# Patient Record
Sex: Male | Born: 1990 | Race: White | Hispanic: No | Marital: Single | Smoking: Current every day smoker
Health system: Southern US, Community
[De-identification: ages and names within clinical notes are randomized; demographics above are authoritative.]

## PROBLEM LIST (undated history)

## (undated) HISTORY — PX: FRACTURE SURGERY: SHX138

---

## 2014-06-18 ENCOUNTER — Emergency Department: Payer: Self-pay | Admitting: Emergency Medicine

## 2015-10-24 IMAGING — CR DG HAND COMPLETE 3+V*L*
1 series · 3 of 3 positions shown · non-contrast
Comparison: None.

CLINICAL DATA: For body and left hand. Abscess on the left hand at
the second MCP.

EXAM:
LEFT HAND - COMPLETE 3+ VIEW

[Series 1: pa · 0.17mm/px · 3 of 3 slices shown]
[im 1/3]
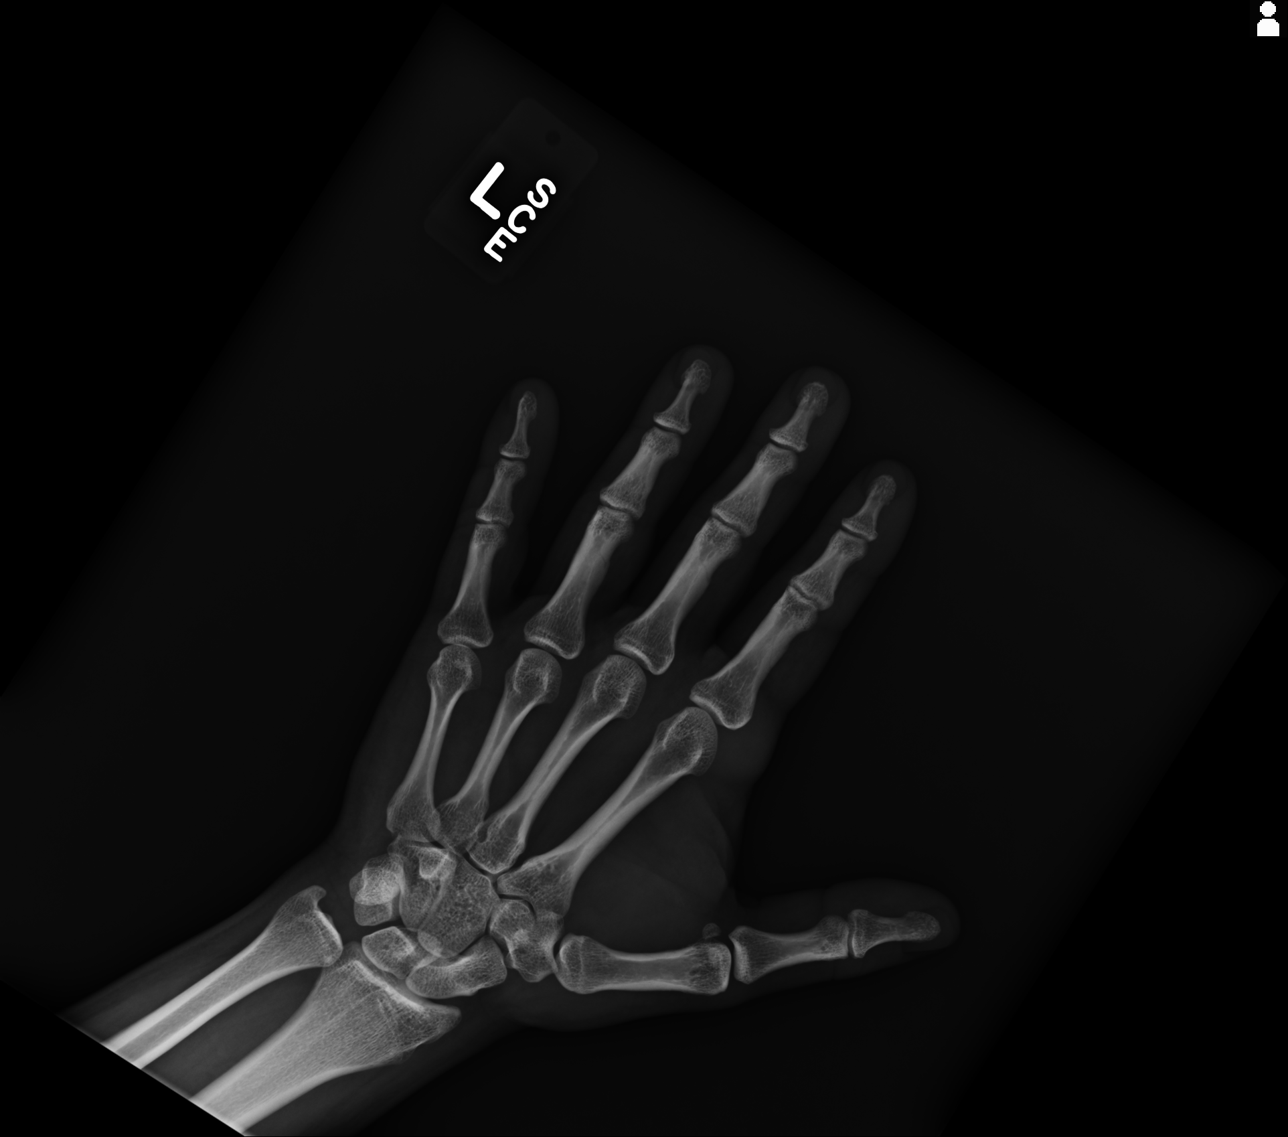
[im 2/3]
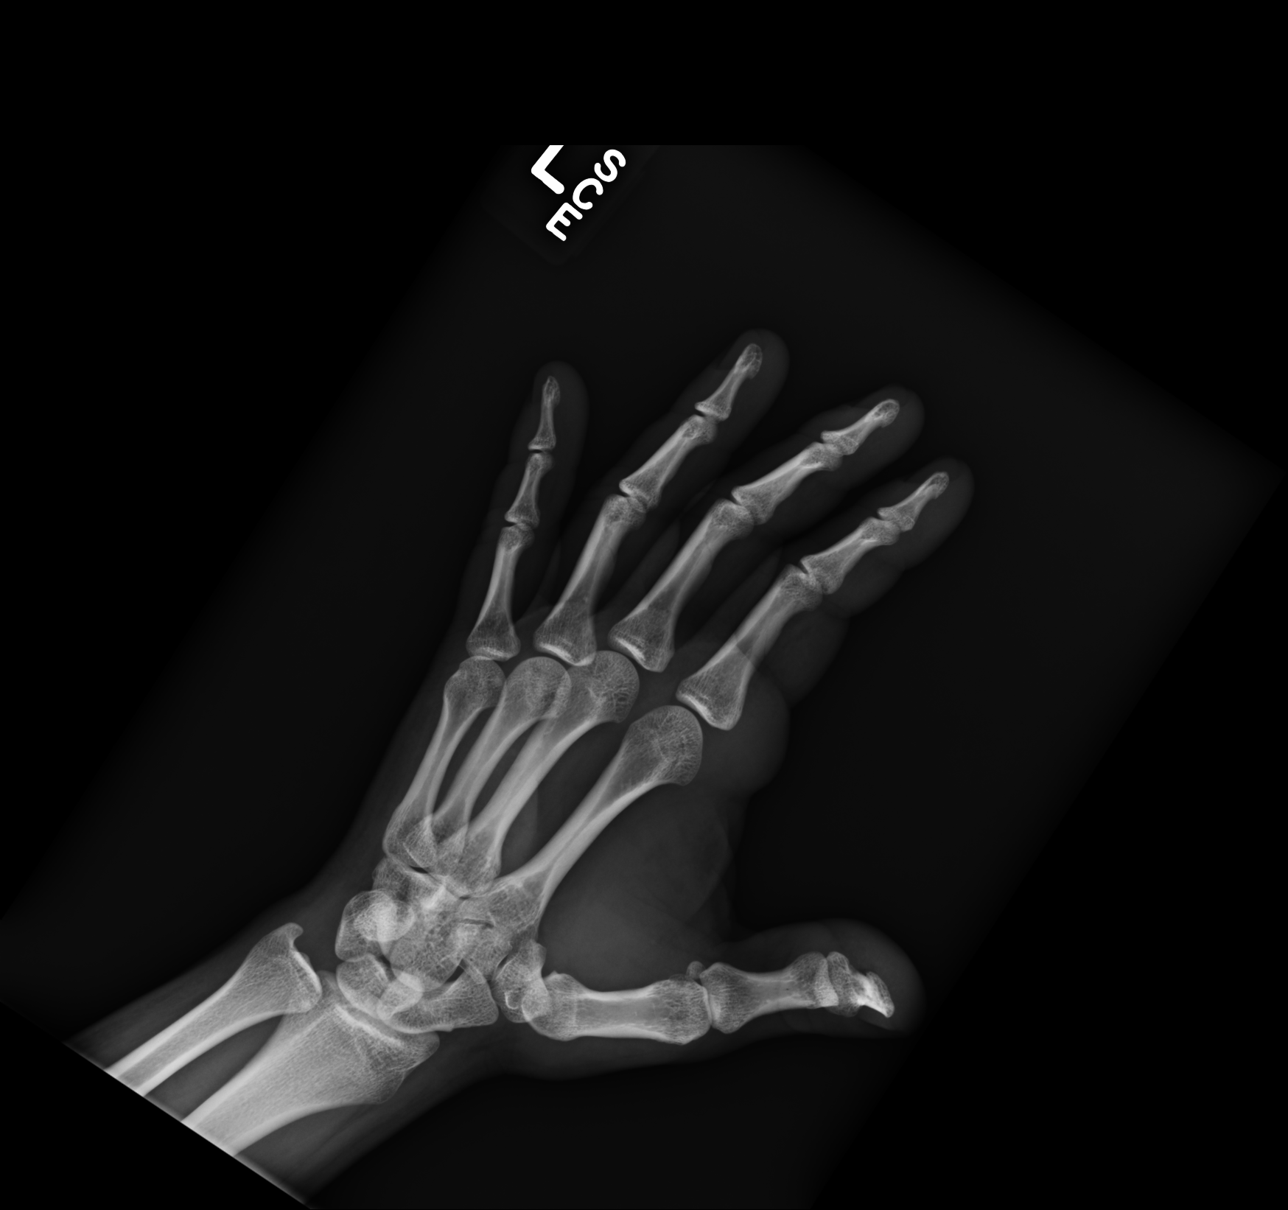
[im 3/3]
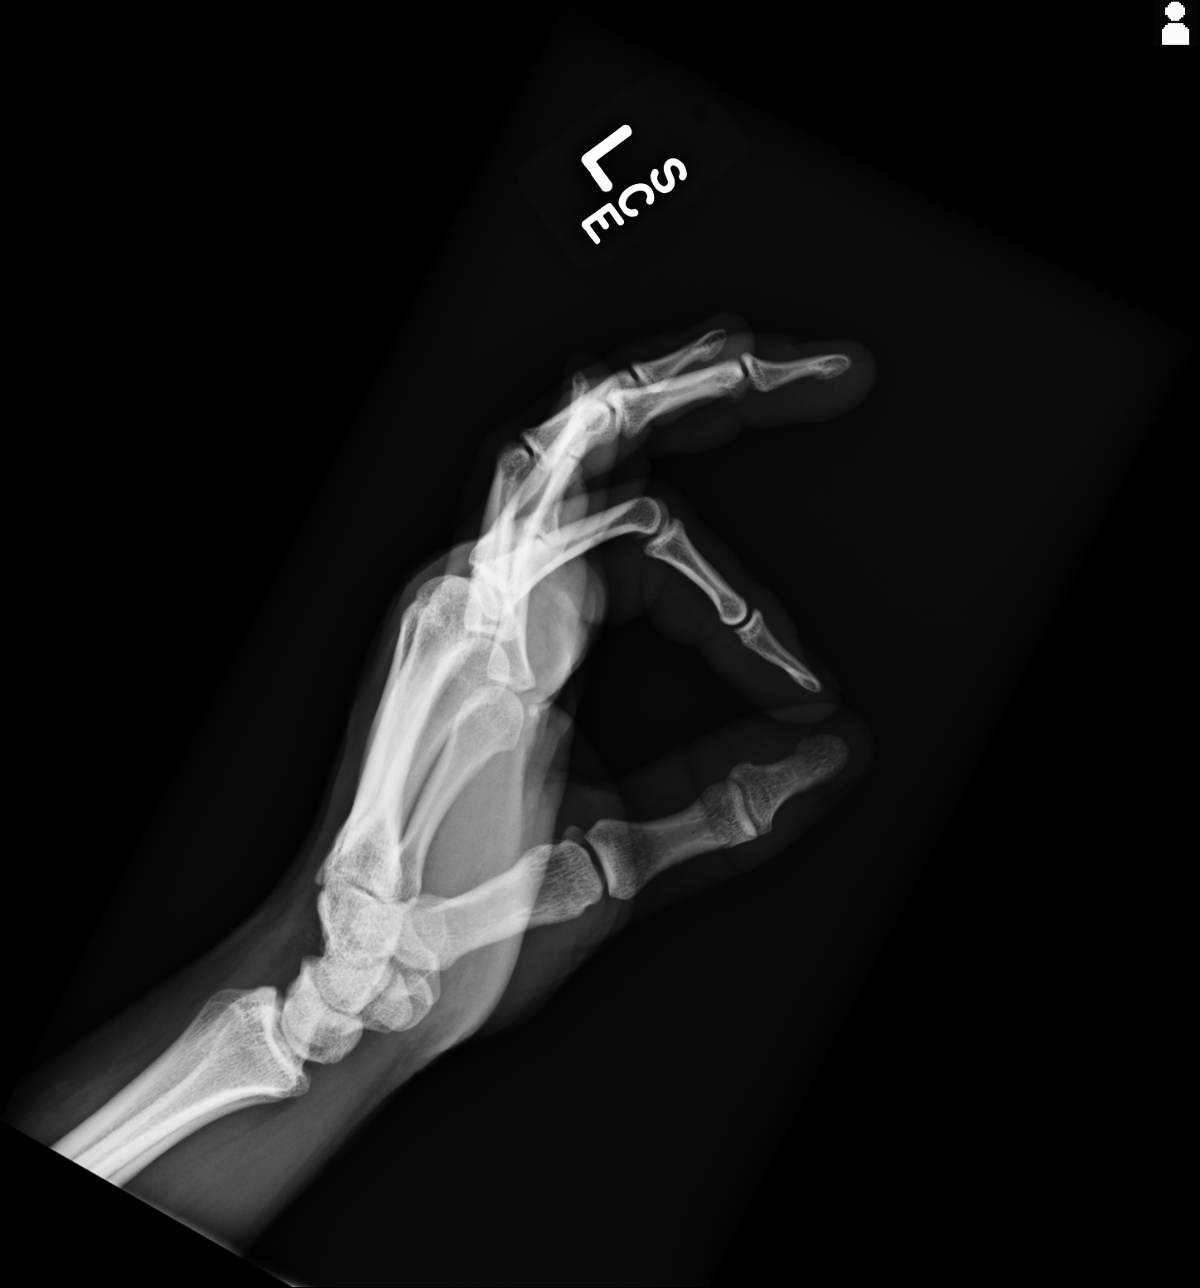

[3 of 3 positions shown; findings below may reference images not displayed]

FINDINGS: There is no evidence of fracture or dislocation. There is no
evidence of arthropathy or other focal bone abnormality. Soft tissue
swelling around the second MCP joint. No radiopaque foreign body
identified.
IMPRESSION: 1. Focal soft tissue swelling around the second MCP joint of the
left hand. No radiopaque foreign body. No osseous findings to
suggest osteomyelitis.

## 2019-04-17 ENCOUNTER — Encounter: Payer: Self-pay | Admitting: Emergency Medicine

## 2019-04-17 ENCOUNTER — Other Ambulatory Visit: Payer: Self-pay

## 2019-04-17 ENCOUNTER — Ambulatory Visit
Admission: EM | Admit: 2019-04-17 | Discharge: 2019-04-17 | Disposition: A | Discharge status: Home / Self Care | Payer: BC Managed Care – PPO | Attending: Family Medicine | Admitting: Family Medicine

## 2019-04-17 DIAGNOSIS — R112 Nausea with vomiting, unspecified: Secondary | ICD-10-CM

## 2019-04-17 LAB — COMPREHENSIVE METABOLIC PANEL
ALT: 17 U/L (ref 0–44)
AST: 15 U/L (ref 15–41)
Albumin: 4.6 g/dL (ref 3.5–5.0)
Alkaline Phosphatase: 59 U/L (ref 38–126)
Anion gap: 12 (ref 5–15)
BUN: 12 mg/dL (ref 6–20)
CO2: 22 mmol/L (ref 22–32)
Calcium: 8.8 mg/dL — ABNORMAL LOW (ref 8.9–10.3)
Chloride: 104 mmol/L (ref 98–111)
Creatinine, Ser: 0.59 mg/dL — ABNORMAL LOW (ref 0.61–1.24)
GFR calc Af Amer: >60 mL/min (ref >60)
GFR calc non Af Amer: >60 mL/min (ref >60)
Glucose, Bld: 102 mg/dL — ABNORMAL HIGH (ref 70–99)
Potassium: 3.7 mmol/L (ref 3.5–5.1)
Sodium: 138 mmol/L (ref 135–145)
Total Bilirubin: 0.8 mg/dL (ref 0.3–1.2)
Total Protein: 7.3 g/dL (ref 6.5–8.1)

## 2019-04-17 LAB — CBC WITH DIFFERENTIAL/PLATELET
Abs Immature Granulocytes: 0.05 10*3/uL (ref 0.00–0.07)
Basophils Absolute: 0 10*3/uL (ref 0.0–0.1)
Basophils Relative: 0 %
Eosinophils Absolute: 0.1 10*3/uL (ref 0.0–0.5)
Eosinophils Relative: 1 %
HCT: 47.3 % (ref 39.0–52.0)
Hemoglobin: 16.7 g/dL (ref 13.0–17.0)
Immature Granulocytes: 0 %
Lymphocytes Relative: 14 %
Lymphs Abs: 1.6 10*3/uL (ref 0.7–4.0)
MCH: 31.2 pg (ref 26.0–34.0)
MCHC: 35.3 g/dL (ref 30.0–36.0)
MCV: 88.4 fL (ref 80.0–100.0)
Monocytes Absolute: 0.5 10*3/uL (ref 0.1–1.0)
Monocytes Relative: 4 %
Neutro Abs: 9.2 10*3/uL — ABNORMAL HIGH (ref 1.7–7.7)
Neutrophils Relative %: 81 %
Platelets: 189 10*3/uL (ref 150–400)
RBC: 5.35 MIL/uL (ref 4.22–5.81)
RDW: 12.6 % (ref 11.5–15.5)
WBC: 11.4 10*3/uL — ABNORMAL HIGH (ref 4.0–10.5)
nRBC: 0 % (ref 0.0–0.2)

## 2019-04-17 LAB — LIPASE, BLOOD: Lipase: 27 U/L (ref 11–51)

## 2019-04-17 MED ORDER — ONDANSETRON HCL 4 MG PO TABS: 4.0000 mg | ORAL_TABLET | Freq: Three times a day (TID) | ORAL | 1 refills | Status: AC | PRN

## 2019-04-17 NOTE — ED Triage Notes (Signed)
Pt states he woke up this morning vomiting and coughed up blood. He states the blood was bright red. He states it was just one episode. He states that he drank last night and thinks it could possibly be related. Denies SOB, fever, or chest pain.

## 2019-04-17 NOTE — ED Provider Notes (Signed)
[facility]    CSN: [id number] Arrival date & time: 04/17/19  1334  History   Chief Complaint Chief Complaint  Patient presents with  . Emesis  . Hemoptysis   HPI  28 year old male presents with nausea, vomiting.  Patient states that he has coughed up blood.  Patient reports that he drank alcohol last night.  He states that he does not routinely drink alcohol.  Had 4 beers.  Had nausea and vomiting this morning.  Vomited several times.  Patient states that he subsequently noted blood when he coughed.  Patient has difficulty discerning whether he coughed up blood or vomited up blood.  No abdominal pain at this time.  No fever.  He has not eaten today.  He has drink some coffee.  He has had no further episodes of bleeding.  No medications or interventions tried.  No shortness of breath.  No cough at this time.  No other associated symptoms.  No other complaints.  PMH, Surgical Hx, Family Hx, Social History reviewed and updated as below.  No significant PMH.  Past Surgical History:  Procedure Laterality Date  . FRACTURE SURGERY Right    hand   Home Medications    Prior to Admission medications   Medication Sig Start Date End Date Taking? Authorizing Provider  Ascorbic Acid (VITAMIN C) 100 MG tablet Take 100 mg by mouth daily.   Yes [provider]  ondansetron (ZOFRAN) 4 MG tablet Take 1 tablet (4 mg total) by mouth every 8 (eight) hours as needed for nausea, vomiting or refractory nausea / vomiting. 04/17/19   Tommie Samsook, Joni Norrod G, DO    Family History Family History  Problem Relation Age of Onset  . Healthy Mother   . Healthy Father     Social History Social History   Tobacco Use  . Smoking status: Current Every Day Smoker    Packs/day: 1.00  . Smokeless tobacco: Never Used  Substance Use Topics  . Alcohol use: Yes  . Drug use: Yes    Types: Marijuana     Allergies   Patient has no known allergies.   Review of Systems Review of Systems   Constitutional: Negative for fever.  Respiratory: Negative for shortness of breath.        ? Hemoptysis vs hematemesis.  Gastrointestinal: Positive for nausea and vomiting.   Physical Exam Triage Vital Signs ED Triage Vitals  Enc Vitals Group     BP 04/17/19 1355 135/79     Pulse Rate 04/17/19 1355 75     Resp 04/17/19 1355 18     Temp 04/17/19 1355 97.8 F (36.6 C)     Temp Source 04/17/19 1355 Oral     SpO2 04/17/19 1355 99 %     Weight 04/17/19 1351 140 lb (63.5 kg)     Height 04/17/19 1351 5\' 6"  (1.676 m)     Head Circumference --      Peak Flow --      Pain Score 04/17/19 1351 0     Pain Loc --      Pain Edu? --      Excl. in [place]? --    Updated Vital Signs BP 135/79 (BP Location: Left Arm)   Pulse 75   Temp 97.8 F (36.6 C) (Oral)   Resp 18   Ht 5\' 6"  (1.676 m)   Wt 63.5 kg   SpO2 99%   BMI 22.60 kg/m   Visual Acuity Right Eye Distance:   Left  Eye Distance:   Bilateral Distance:    Right Eye Near:   Left Eye Near:    Bilateral Near:     Physical Exam Vitals signs and nursing note reviewed.  Constitutional:      General: He is not in acute distress.    Appearance: Normal appearance. He is not ill-appearing.  HENT:     Head: Normocephalic and atraumatic.  Eyes:     General:        Right eye: No discharge.        Left eye: No discharge.     Conjunctiva/sclera: Conjunctivae normal.  Cardiovascular:     Rate and Rhythm: Normal rate and regular rhythm.     Heart sounds: No murmur.  Pulmonary:     Effort: Pulmonary effort is normal. No respiratory distress.     Breath sounds: Normal breath sounds. No wheezing, rhonchi or rales.  Abdominal:     General: There is no distension.     Palpations: Abdomen is soft.     Tenderness: There is no abdominal tenderness. There is no guarding or rebound.  Neurological:     Mental Status: He is alert.  Psychiatric:        Mood and Affect: Mood normal.        Behavior: Behavior normal.    UC Treatments /  Results  Labs (all labs ordered are listed, but only abnormal results are displayed) Labs Reviewed  CBC WITH DIFFERENTIAL/PLATELET - Abnormal; Notable for the following components:      Result Value   WBC 11.4 (*)    Neutro Abs 9.2 (*)    All other components within normal limits  COMPREHENSIVE METABOLIC PANEL - Abnormal; Notable for the following components:   Glucose, Bld 102 (*)    Creatinine, Ser 0.59 (*)    Calcium 8.8 (*)    All other components within normal limits  LIPASE, BLOOD   EKG  Radiology No results found.  Procedures Procedures (including critical care time)  Medications Ordered in UC Medications - No data to display  Initial Impression / Assessment and Plan / UC Course  I have reviewed the triage vital signs and the nursing notes.  Pertinent labs & imaging results that were available during my care of the patient were reviewed by me and considered in my medical decision making (see chart for details).    28 year old male presents with nausea, vomiting.  Patient also reporting hematemesis versus hemoptysis.  He is well-appearing.  Labs stable.  Zofran as needed.  Push fluids.  Work note given.  Final Clinical Impressions(s) / UC Diagnoses   Final diagnoses:  Non-intractable vomiting with nausea, unspecified vomiting type     Discharge Instructions     Rest.  Fluids.  Zofran as needed.  Take care  Dr. Lacinda Axon     ED Prescriptions    Medication Sig Dispense Auth. Provider   ondansetron (ZOFRAN) 4 MG tablet Take 1 tablet (4 mg total) by mouth every 8 (eight) hours as needed for nausea, vomiting or refractory nausea / vomiting. 20 tablet Coral Spikes, DO     Controlled Substance Prescriptions [place] Controlled Substance Registry consulted? Not Applicable   Coral Spikes, DO 04/17/19 1459

## 2019-04-17 NOTE — Discharge Instructions (Signed)
Rest. Fluids. ° °Zofran as needed.  ° °Take care ° °Dr. Shannon Balthazar  °

## 2021-09-05 ENCOUNTER — Encounter: Payer: Self-pay | Admitting: Emergency Medicine

## 2021-09-05 ENCOUNTER — Other Ambulatory Visit: Payer: Self-pay

## 2021-09-05 ENCOUNTER — Emergency Department
Admission: EM | Admit: 2021-09-05 | Discharge: 2021-09-05 | Disposition: A | Discharge status: Home / Self Care | Payer: BC Managed Care – PPO | Attending: Emergency Medicine | Admitting: Emergency Medicine

## 2021-09-05 DIAGNOSIS — F17210 Nicotine dependence, cigarettes, uncomplicated: Secondary | ICD-10-CM | POA: Insufficient documentation

## 2021-09-05 DIAGNOSIS — H66002 Acute suppurative otitis media without spontaneous rupture of ear drum, left ear: Secondary | ICD-10-CM | POA: Insufficient documentation

## 2021-09-05 DIAGNOSIS — H6983 Other specified disorders of Eustachian tube, bilateral: Secondary | ICD-10-CM

## 2021-09-05 DIAGNOSIS — H6993 Unspecified Eustachian tube disorder, bilateral: Secondary | ICD-10-CM | POA: Insufficient documentation

## 2021-09-05 MED ORDER — PREDNISONE 10 MG PO TABS: ORAL_TABLET | ORAL | 0 refills | Status: AC

## 2021-09-05 MED ORDER — CEFDINIR 300 MG PO CAPS: 300.0000 mg | ORAL_CAPSULE | Freq: Two times a day (BID) | ORAL | 0 refills | Status: AC

## 2021-09-05 NOTE — Discharge Instructions (Addendum)
Follow up with your primary care if any continued problems or Dr. Elenore Rota who is on-call for [facility] if not improving.  Begin taking antibiotic cefdinir twice a day until completely finished.  The prednisone you begin today with 6 tablets and taper down 1 tablet each day until completely finished.  You may also take Tylenol with this if needed for pain.

## 2021-09-05 NOTE — ED Notes (Signed)
See triage note, pt having trouble hearing on both side. Pt is A&Ox4 and NAD

## 2021-09-05 NOTE — ED Triage Notes (Signed)
Pt reports pain back and forth between both ears for the last month. Pt wife reports she sometimes has to yell to get him to hear. Pt denies all other sx's.

## 2021-09-05 NOTE — ED Provider Notes (Signed)
[facility] Provider Note    Event Date/Time   First MD Initiated Contact with Patient 09/05/21 [phone]     (approximate)   History   Otalgia   HPI.   Allen Bell is a 31 y.o. male   presents to the ED with complaint of decreased hearing for the last month.  Patient states that the sensation goes back and forth between both his ears and wife states that sometimes she feels like she has to yell at him in order for him to hear.  He has worked his ears out with some peroxide thinking that it was earwax.  He denies any upper respiratory symptoms, fever, chills or cough.  He denies any health problems but admits that he smokes approximately 1 pack cigarettes per day.  He rates his pain as 7 out of 10.      Physical Exam   Triage Vital Signs: ED Triage Vitals  Enc Vitals Group     BP 09/05/21 0942 127/68     Pulse Rate 09/05/21 0942 82     Resp 09/05/21 0942 18     Temp 09/05/21 0942 98.1 F (36.7 C)     Temp Source 09/05/21 0942 Oral     SpO2 09/05/21 0942 99 %     Weight 09/05/21 0925 143 lb 4.8 oz (65 kg)     Height 09/05/21 0925 5\' 6"  (1.676 m)     Head Circumference --      Peak Flow --      Pain Score 09/05/21 0925 7     Pain Loc --      Pain Edu? --      Excl. in [place]? --     Most recent vital signs: Vitals:   09/05/21 0942  BP: 127/68  Pulse: 82  Resp: 18  Temp: 98.1 F (36.7 C)  SpO2: 99%     General: Awake, no distress.  CV:  Good peripheral perfusion.  Heart regular rate and rhythm without murmur. Resp:  Normal effort.  Lungs are clear bilaterally. Abd:  No distention.  Other:  EACs are clear, right TM is dull with poor light reflex.  Left TM is mildly erythematous with no injection.  No fluid level.  Poor light reflection also.   ED Results / Procedures / Treatments   Labs (all labs ordered are listed, but only abnormal results are displayed) Labs Reviewed - No data to display     PROCEDURES:  Critical Care  performed:   Procedures   MEDICATIONS ORDERED IN ED: Medications - No data to display   IMPRESSION / MDM / [place] / ED COURSE  I reviewed the triage vital signs and the nursing notes.   Differential diagnosis includes, but is not limited to, cerumen impaction, perforation tympanic membrane, otitis media, otitis externa, eustachian tube dysfunction.  31 year old male presents to the ED with complaint of bilateral intermittent ear pain for the last month.  Patient denies any upper respiratory or symptoms of fever or chills.  Patient does smoke 1 pack cigarettes per day.  On exam EACs are clear.  Bilateral TMs are dull with poor light reflex and the left one is mildly erythematous but no injection is noted.  When asked see if he can get his ears to pop by holding his nose patient states he was unable to relieve the pressure that way.  I explained eustachian tube dysfunction to him and his wife and also the early otitis  media that appears on his left ear.  Patient was given a prescription for prednisone and also Omnicef to begin taking.  He is to follow-up with Dr.Juengel at [facility] if any continued problems or not improving.        FINAL CLINICAL IMPRESSION(S) / ED DIAGNOSES   Final diagnoses:  Eustachian tube dysfunction, bilateral  Non-recurrent acute suppurative otitis media of left ear without spontaneous rupture of tympanic membrane     Rx / DC Orders   ED Discharge Orders          Ordered    predniSONE (DELTASONE) 10 MG tablet        09/05/21 1031    cefdinir (OMNICEF) 300 MG capsule  2 times daily        09/05/21 1031             Note:  This document was prepared using Dragon voice recognition software and may include unintentional dictation errors.   Johnn Hai, PA-C 09/05/21 1508    Vanessa [place], MD 09/06/21 [phone]
